# Patient Record
Sex: Female | Born: 1970 | Race: White | Hispanic: No | Marital: Single | Smoking: Current some day smoker
Health system: Southern US, Community
[De-identification: ages and names within clinical notes are randomized; demographics above are authoritative.]

## PROBLEM LIST (undated history)

## (undated) HISTORY — PX: HEMORRHOID SURGERY: SHX153

---

## 2002-01-11 ENCOUNTER — Encounter: Payer: Self-pay | Admitting: Obstetrics

## 2002-01-11 ENCOUNTER — Inpatient Hospital Stay (HOSPITAL_COMMUNITY): Admission: AD | Admit: 2002-01-11 | Discharge: 2002-01-11 | Payer: Self-pay | Admitting: Obstetrics & Gynecology

## 2004-09-10 ENCOUNTER — Inpatient Hospital Stay (HOSPITAL_COMMUNITY): Admission: AD | Admit: 2004-09-10 | Discharge: 2004-09-11 | Payer: Self-pay | Admitting: Obstetrics

## 2004-12-11 ENCOUNTER — Inpatient Hospital Stay (HOSPITAL_COMMUNITY): Admission: AD | Admit: 2004-12-11 | Discharge: 2004-12-11 | Payer: Self-pay | Admitting: Obstetrics

## 2004-12-12 ENCOUNTER — Inpatient Hospital Stay (HOSPITAL_COMMUNITY): Admission: AD | Admit: 2004-12-12 | Discharge: 2004-12-14 | Payer: Self-pay | Admitting: Obstetrics

## 2005-02-14 ENCOUNTER — Encounter (INDEPENDENT_AMBULATORY_CARE_PROVIDER_SITE_OTHER): Payer: Self-pay | Admitting: Specialist

## 2005-02-14 ENCOUNTER — Ambulatory Visit (HOSPITAL_COMMUNITY): Admission: RE | Admit: 2005-02-14 | Discharge: 2005-02-14 | Payer: Self-pay | Admitting: General Surgery

## 2009-03-05 ENCOUNTER — Emergency Department (HOSPITAL_COMMUNITY): Admission: EM | Admit: 2009-03-05 | Discharge: 2009-03-05 | Payer: Self-pay | Admitting: Emergency Medicine

## 2009-10-14 ENCOUNTER — Emergency Department (HOSPITAL_COMMUNITY): Admission: EM | Admit: 2009-10-14 | Discharge: 2009-10-14 | Payer: Self-pay | Admitting: Family Medicine

## 2011-01-12 ENCOUNTER — Encounter: Payer: Self-pay | Admitting: Obstetrics & Gynecology

## 2011-04-04 LAB — RAPID STREP SCREEN (MED CTR MEBANE ONLY): Streptococcus, Group A Screen (Direct): NEGATIVE

## 2011-05-10 NOTE — Op Note (Signed)
Katherine Chan, Katherine Chan           ACCOUNT NO.:  [id number]   MEDICAL RECORD NO.:  [id number]          PATIENT TYPE:  AMB   LOCATION:  DAY                          FACILITY:  [facility]   PHYSICIAN:  Leonie Man, M.D.   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   PROCEDURE:  Excision of anal polyp and PPH.   SURGEON:  Dr. Lurene Shadow.   ASSISTANT:  Nurse.   ANESTHESIA:  General.   The patient is a 40 year old woman with prolapsing hemorrhoid disease who on  examination is noted to also have an anal polyp. She wishes to have her  hemorrhoids removed as well as the polyps, and she comes to the operating  room for same.   PROCEDURE:  The procedure details are discussed with respect to the risks  and benefits with the patient. She understands these risks and gives  consent. Following induction of satisfactory anesthesia, the patient is  positioned prone position and then with the table adjusted into the  jackknife position. Buttock cheeks are spread apart to expose the anus. The  perineal area and vagina prepped and draped to be included in a sterile  operative field. I infiltrated the perianal region with 1% lidocaine with  epinephrine and W[place]. The anal verge then dilated up to three  fingerbreadths and an operating anoscope was placed within the anus. A purse-  string suture is made approximately 4.5 cm above the dentate line to  completely encircle the anal canal. The purse-string suture is tested and  drawn taut. There no sutures tenting of the area of the vaginal wall. The  PPH device was then placed with the anvil above the suture line, and this is  tightened down to the full 4-cm length while pulling on the purse-string  sutures so as to pull  the hemorrhoidal tissues into the PPH stapling device.  Pressure is held in this position for a total of 60 seconds, and then the  [facility] device is fired. Additional pressure is held for an additional 30  seconds and the PPH device then is removed. The specimen which measured  approximately 2 cm in width in a complete circle was noted and that was  forwarded for pathologic evaluation. There were no muscle fibers within the  specimen. The suture line was then checked for hemostasis. Additional  bleeding points treated with electrocautery. A Gelfoam pack was then placed  at the suture line for additional hemostasis. Sponge and instrument counts  were verified, and the patient removed from the operating room to the  recovery room in stable condition. She tolerated the procedure well.    PB/MEDQ  D:  02/14/2005  T:  02/14/2005  Job:  [id number]

## 2012-03-31 ENCOUNTER — Emergency Department (INDEPENDENT_AMBULATORY_CARE_PROVIDER_SITE_OTHER)
Admission: EM | Admit: 2012-03-31 | Discharge: 2012-03-31 | Disposition: A | Discharge status: Home / Self Care | Payer: Self-pay | Source: Home / Self Care | Attending: Emergency Medicine | Admitting: Emergency Medicine

## 2012-03-31 ENCOUNTER — Encounter (HOSPITAL_COMMUNITY): Payer: Self-pay

## 2012-03-31 DIAGNOSIS — J020 Streptococcal pharyngitis: Secondary | ICD-10-CM

## 2012-03-31 MED ORDER — PENICILLIN V POTASSIUM 500 MG PO TABS: 500.0000 mg | ORAL_TABLET | Freq: Three times a day (TID) | ORAL | Status: AC

## 2012-03-31 MED ORDER — GUAIFENESIN-CODEINE 100-10 MG/5ML PO SYRP: 5.0000 mL | ORAL_SOLUTION | Freq: Three times a day (TID) | ORAL | Status: AC | PRN

## 2012-03-31 NOTE — ED Provider Notes (Signed)
History     CSN: [id number]  Arrival date & time 03/31/12  4098   First MD Initiated Contact with Patient 03/31/12 [phone]      Chief Complaint  Patient presents with  . Sore Throat  . Cough    (Consider location/radiation/quality/duration/timing/severity/associated sxs/prior treatment) HPI Comments: Patient presents urgent care complaining of a moderate sore throat ear pressure discomfort with predominantly right ear pressure congestion. With a greenish type of mucousy discharge when she blows her nose. Did experience a tactile fever at home Saturday. No shortness of breath but within the last 2 days throat discomfort has increased  Patient is a 41 y.o. female presenting with pharyngitis and cough. The history is provided by the patient.  Sore Throat This is a new problem. The current episode started more than 2 days ago. The problem occurs constantly. The problem has not changed since onset.Associated symptoms include headaches. Pertinent negatives include no chest pain and no shortness of breath. She has tried acetaminophen for the symptoms. The treatment provided no relief.  Cough Associated symptoms include chills, headaches and rhinorrhea. Pertinent negatives include no chest pain, no ear pain and no shortness of breath.    History reviewed. No pertinent past medical history.  Past Surgical History  Procedure Date  . Hemorrhoid surgery     No family history on file.  History  Substance Use Topics  . Smoking status: Never Smoker   . Smokeless tobacco: Not on file  . Alcohol Use: Yes    OB History    Grav Para Term Preterm Abortions TAB SAB Ect Mult Living                  Review of Systems  Constitutional: Positive for fever, chills, appetite change and fatigue.  HENT: Positive for congestion and rhinorrhea. Negative for ear pain, neck pain and neck stiffness.   Eyes: Negative for photophobia and pain.  Respiratory: Positive for cough. Negative for chest tightness  and shortness of breath.   Cardiovascular: Negative for chest pain.  Genitourinary: Negative for dysuria.  Neurological: Positive for headaches.    Allergies  Review of patient's allergies indicates no known allergies.  Home Medications  No current outpatient prescriptions on file.  BP 128/78  Pulse 74  Temp(Src) 98.2 F (36.8 C) (Oral)  Resp 14  SpO2 100%  LMP 03/23/2012  Physical Exam  Nursing note and vitals reviewed. Constitutional: She appears well-developed and well-nourished.  Non-toxic appearance. She does not have a sickly appearance. She does not appear ill. No distress.  HENT:  Head: Normocephalic.  Right Ear: Tympanic membrane normal.  Left Ear: Tympanic membrane normal.  Nose: Nose normal.  Mouth/Throat: Uvula is midline and mucous membranes are normal. Posterior oropharyngeal edema and posterior oropharyngeal erythema present. No tonsillar abscesses.  Eyes: Conjunctivae are normal. No scleral icterus.  Neck: Neck supple. No JVD present.  Pulmonary/Chest: Effort normal. No respiratory distress. She has no decreased breath sounds. She has no wheezes. She has no rhonchi. She has no rales.  Lymphadenopathy:    She has cervical adenopathy.  Skin: No rash noted. No erythema.    ED Course  Procedures (including critical care time)  Labs Reviewed  POCT RAPID STREP A ([facility] ONLY) - Abnormal; Notable for the following:    Streptococcus, Group A Screen (Direct) POSITIVE (*)    All other components within normal limits   No results found.   No diagnosis found.    MDM  Marked pharyngitis with  reactive lymphadenopathies.        Jimmie Molly, MD 03/31/12 1011

## 2012-03-31 NOTE — ED Notes (Signed)
States she flew on airplane Friday.  On Saturday started with sore throat, rt ear congestion and congestion between rt ear and nose, cough.  States she has had some green nasal secretions.

## 2012-03-31 NOTE — Discharge Instructions (Signed)

## 2014-06-21 ENCOUNTER — Encounter (HOSPITAL_COMMUNITY): Payer: Self-pay | Admitting: Emergency Medicine

## 2014-06-21 ENCOUNTER — Emergency Department (HOSPITAL_COMMUNITY)
Admission: EM | Admit: 2014-06-21 | Discharge: 2014-06-21 | Disposition: A | Discharge status: Home / Self Care | Payer: No Typology Code available for payment source | Attending: Emergency Medicine | Admitting: Emergency Medicine

## 2014-06-21 ENCOUNTER — Emergency Department (HOSPITAL_COMMUNITY): Payer: No Typology Code available for payment source

## 2014-06-21 DIAGNOSIS — F17200 Nicotine dependence, unspecified, uncomplicated: Secondary | ICD-10-CM | POA: Insufficient documentation

## 2014-06-21 DIAGNOSIS — Z7982 Long term (current) use of aspirin: Secondary | ICD-10-CM | POA: Insufficient documentation

## 2014-06-21 DIAGNOSIS — Z79899 Other long term (current) drug therapy: Secondary | ICD-10-CM | POA: Insufficient documentation

## 2014-06-21 DIAGNOSIS — R0602 Shortness of breath: Secondary | ICD-10-CM | POA: Insufficient documentation

## 2014-06-21 DIAGNOSIS — R079 Chest pain, unspecified: Secondary | ICD-10-CM | POA: Insufficient documentation

## 2014-06-21 LAB — CBC WITH DIFFERENTIAL/PLATELET
BASOS ABS: 0 10*3/uL (ref 0.0–0.1)
BASOS PCT: 0 % (ref 0–1)
EOS ABS: 0.5 10*3/uL (ref 0.0–0.7)
EOS PCT: 4 % (ref 0–5)
HCT: 41.6 % (ref 36.0–46.0)
Hemoglobin: 13.7 g/dL (ref 12.0–15.0)
Lymphocytes Relative: 14 % (ref 12–46)
Lymphs Abs: 1.6 10*3/uL (ref 0.7–4.0)
MCH: 31.5 pg (ref 26.0–34.0)
MCHC: 32.9 g/dL (ref 30.0–36.0)
MCV: 95.6 fL (ref 78.0–100.0)
MONO ABS: 1.1 10*3/uL — AB (ref 0.1–1.0)
Monocytes Relative: 10 % (ref 3–12)
Neutro Abs: 8.4 10*3/uL — ABNORMAL HIGH (ref 1.7–7.7)
Neutrophils Relative %: 72 % (ref 43–77)
PLATELETS: 205 10*3/uL (ref 150–400)
RBC: 4.35 MIL/uL (ref 3.87–5.11)
RDW: 13.6 % (ref 11.5–15.5)
WBC: 11.6 10*3/uL — AB (ref 4.0–10.5)

## 2014-06-21 LAB — I-STAT TROPONIN, ED
TROPONIN I, POC: 0 ng/mL (ref 0.00–0.08)
Troponin i, poc: 0 ng/mL (ref 0.00–0.08)

## 2014-06-21 LAB — BASIC METABOLIC PANEL
BUN: 10 mg/dL (ref 6–23)
CALCIUM: 8.6 mg/dL (ref 8.4–10.5)
CO2: 25 mEq/L (ref 19–32)
CREATININE: 0.75 mg/dL (ref 0.50–1.10)
Chloride: 105 mEq/L (ref 96–112)
GFR calc Af Amer: >90 mL/min (ref >90)
Glucose, Bld: 90 mg/dL (ref 70–99)
Potassium: 3.8 mEq/L (ref 3.7–5.3)
SODIUM: 142 meq/L (ref 137–147)

## 2014-06-21 NOTE — ED Notes (Signed)
Pt presents to department for evaluation of midsternal chest pain and SOB. Onset this morning. 3/10 pain upon arrival, becomes worse with laying flat. Respirations unlabored. Pt is alert and oriented x4. NAD.

## 2014-06-21 NOTE — Discharge Instructions (Signed)
Return to the ED with any concerns including increased pain, difficulty breathing, leg swelling, fainting, decreased level of alertness/lethargy, or any other alarming symptoms

## 2014-06-21 NOTE — ED Provider Notes (Signed)
CSN: [id number]     Arrival date & time 06/21/14  0901 History   First MD Initiated Contact with Patient 06/21/14 [phone]     Chief Complaint  Patient presents with  . Chest Pain     (Consider location/radiation/quality/duration/timing/severity/associated sxs/prior Treatment) HPI Pt presents with c/o midsternal chest pain.  She states the pain is worse with lying and flat and worse with movement of her shoulders.  No fever/chills.  No cough.  No abodminal discomfort or nausea.  Pt states pain feels like a soreness.  No sob, no nausea, no diaphoresis.  No radiation of the pain.  No hx of DVT/PE, no hx of travel trauma/surgery.  There are no other associated systemic symptoms, there are no other alleviating or modifying factors.   History reviewed. No pertinent past medical history. Past Surgical History  Procedure Laterality Date  . Hemorrhoid surgery     No family history on file. History  Substance Use Topics  . Smoking status: Current Some Day Smoker    Types: Cigarettes  . Smokeless tobacco: Not on file  . Alcohol Use: Yes   OB History   Grav Para Term Preterm Abortions TAB SAB Ect Mult Living                 Review of Systems ROS reviewed and all otherwise negative except for mentioned in HPI    Allergies  Review of patient's allergies indicates no known allergies.  Home Medications   Prior to Admission medications   Medication Sig Start Date End Date Taking? Authorizing Provider  Aspirin-Acetaminophen-Caffeine (GOODY HEADACHE PO) Take 1 packet by mouth daily as needed (for pain).   Yes Historical Provider, MD  Multiple Vitamin (MULTIVITAMIN WITH MINERALS) TABS tablet Take 1 tablet by mouth daily as needed (for vitamin).   Yes Historical Provider, MD  Nutritional Supp - Diet Aids (ULTRA SLIM QUICK PO) Take 3 capsules by mouth 2 (two) times daily.   Yes Historical Provider, MD   BP 101/64  Pulse 57  Temp(Src) 98 F (36.7 C) (Oral)  Resp 16  SpO2 99% Vitals  reviewed Physical Exam Physical Examination: General appearance - alert, well appearing, and in no distress Mental status - alert, oriented to person, place, and time Eyes - no conjunctival injection, no scleral icterus Mouth - mucous membranes moist, pharynx normal without lesions Chest - clear to auscultation, no wheezes, rales or rhonchi, symmetric air entry, nontender to palpation Heart - normal rate, regular rhythm, normal S1, S2, no murmurs, rubs, clicks or gallops Abdomen - soft, nontender, nondistended, no masses or organomegaly Extremities - peripheral pulses normal, no pedal edema, no clubbing or cyanosis Skin - normal coloration and turgor, no rashes  ED Course  Procedures (including critical care time) Labs Review Labs Reviewed  CBC WITH DIFFERENTIAL - Abnormal; Notable for the following:    WBC 11.6 (*)    Neutro Abs 8.4 (*)    Monocytes Absolute 1.1 (*)    All other components within normal limits  BASIC METABOLIC PANEL  I-STAT TROPOININ, ED  I-STAT TROPOININ, ED    Imaging Review No results found.   EKG Interpretation   Date/Time:  Tuesday June 21 2014 09:08:06 EDT Ventricular Rate:  65 PR Interval:  146 QRS Duration: 86 QT Interval:  392 QTC Calculation: 407 R Axis:   92 Text Interpretation:  Normal sinus rhythm with sinus arrhythmia Rightward  axis Borderline ECG ED PHYSICIAN INTERPRETATION AVAILABLE IN [facility] Confirmed by TEST, Record ([id number]) on  06/23/2014 7:03:22 AM      MDM   Final diagnoses:  Chest pain, unspecified chest pain type    Pt presenting with c/o chest discomfort since this morning.  CXR and labs were reassuring including 2 sets of tropnonins.  Doubt ACS, PERC score is 0, doubt other acute emergent procedure at this time.  Discharged with strict return precautions.  Pt agreeable with plan.    Ethelda ChickMartha K Linker, MD 06/24/14 [phone]

## 2018-11-25 NOTE — Progress Notes (Signed)
Ms. Katherine Chan received her flu shot to [facility] by the undersigned. Lot#[id number] NDC: [id number] Mfg: [employer] Exp. 06/22/19

## 2019-06-18 ENCOUNTER — Other Ambulatory Visit (HOSPITAL_COMMUNITY): Payer: Self-pay

## 2019-06-18 DIAGNOSIS — F1120 Opioid dependence, uncomplicated: Secondary | ICD-10-CM

## 2019-06-22 ENCOUNTER — Other Ambulatory Visit (HOSPITAL_COMMUNITY): Payer: No Typology Code available for payment source

## 2020-02-01 ENCOUNTER — Other Ambulatory Visit (HOSPITAL_COMMUNITY)
Admission: RE | Admit: 2020-02-01 | Discharge: 2020-02-01 | Disposition: A | Discharge status: Home / Self Care | Payer: BC Managed Care – PPO | Source: Ambulatory Visit | Attending: Family Medicine | Admitting: Family Medicine

## 2020-02-01 ENCOUNTER — Other Ambulatory Visit: Payer: Self-pay | Admitting: Family Medicine

## 2020-02-01 DIAGNOSIS — Z124 Encounter for screening for malignant neoplasm of cervix: Secondary | ICD-10-CM | POA: Diagnosis present

## 2020-02-02 ENCOUNTER — Other Ambulatory Visit: Payer: Self-pay | Admitting: Family Medicine

## 2020-02-02 DIAGNOSIS — Z1231 Encounter for screening mammogram for malignant neoplasm of breast: Secondary | ICD-10-CM

## 2020-02-03 LAB — CYTOLOGY - PAP
Comment: NEGATIVE
Diagnosis: UNDETERMINED — AB
High risk HPV: NEGATIVE

## 2020-03-08 ENCOUNTER — Ambulatory Visit
Admission: RE | Admit: 2020-03-08 | Discharge: 2020-03-08 | Disposition: A | Discharge status: Home / Self Care | Payer: BC Managed Care – PPO | Source: Ambulatory Visit | Attending: Family Medicine | Admitting: Family Medicine

## 2020-03-08 ENCOUNTER — Other Ambulatory Visit: Payer: Self-pay

## 2020-03-08 DIAGNOSIS — Z1231 Encounter for screening mammogram for malignant neoplasm of breast: Secondary | ICD-10-CM

## 2020-03-10 ENCOUNTER — Other Ambulatory Visit: Payer: Self-pay | Admitting: Family Medicine

## 2020-03-10 DIAGNOSIS — R928 Other abnormal and inconclusive findings on diagnostic imaging of breast: Secondary | ICD-10-CM

## 2020-03-28 ENCOUNTER — Ambulatory Visit
Admission: RE | Admit: 2020-03-28 | Discharge: 2020-03-28 | Disposition: A | Discharge status: Home / Self Care | Payer: BC Managed Care – PPO | Source: Ambulatory Visit | Attending: Family Medicine | Admitting: Family Medicine

## 2020-03-28 ENCOUNTER — Other Ambulatory Visit: Payer: Self-pay

## 2020-03-28 DIAGNOSIS — R928 Other abnormal and inconclusive findings on diagnostic imaging of breast: Secondary | ICD-10-CM

## 2020-07-16 IMAGING — MG DIGITAL SCREENING BILAT W/ TOMO W/ CAD
8 series · 9 of 24 positions shown · non-contrast
Comparison: None

CLINICAL DATA: Screening. Baseline examination.

EXAM:
DIGITAL SCREENING BILATERAL MAMMOGRAM WITH TOMO AND CAD

[L CC synth-2D]
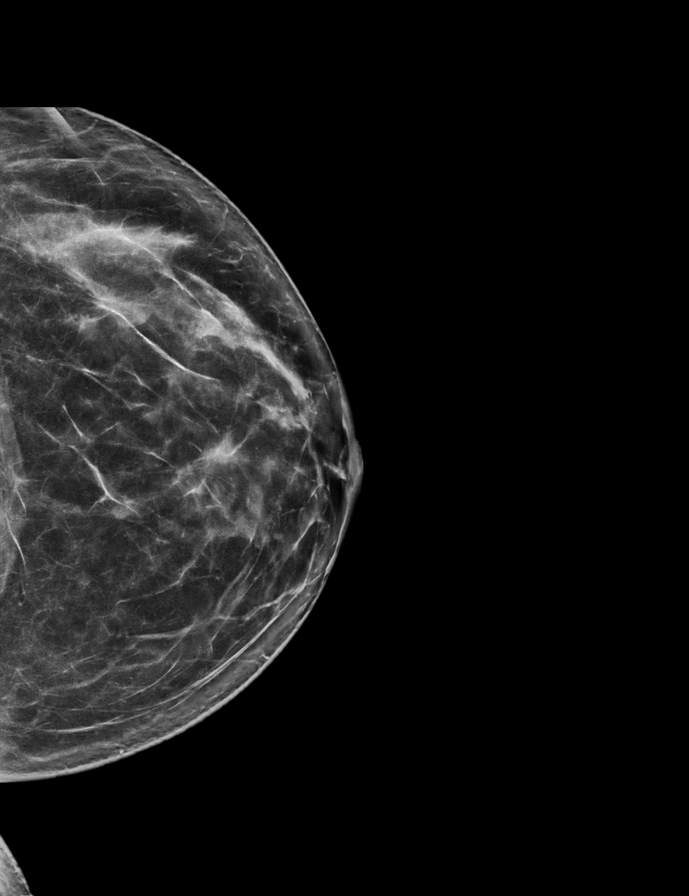

[R CC synth-2D]
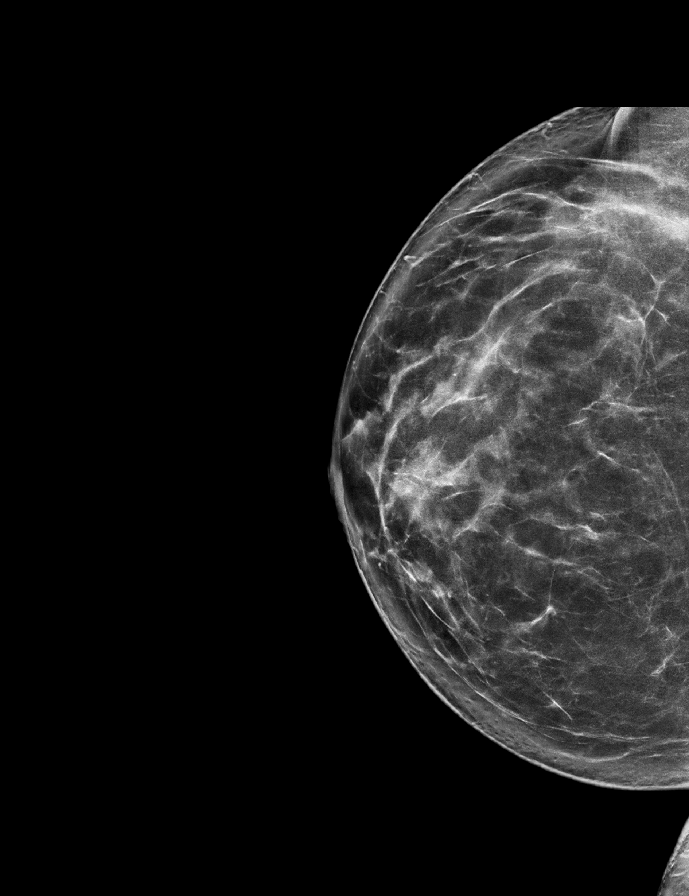

[R MLO synth-2D]
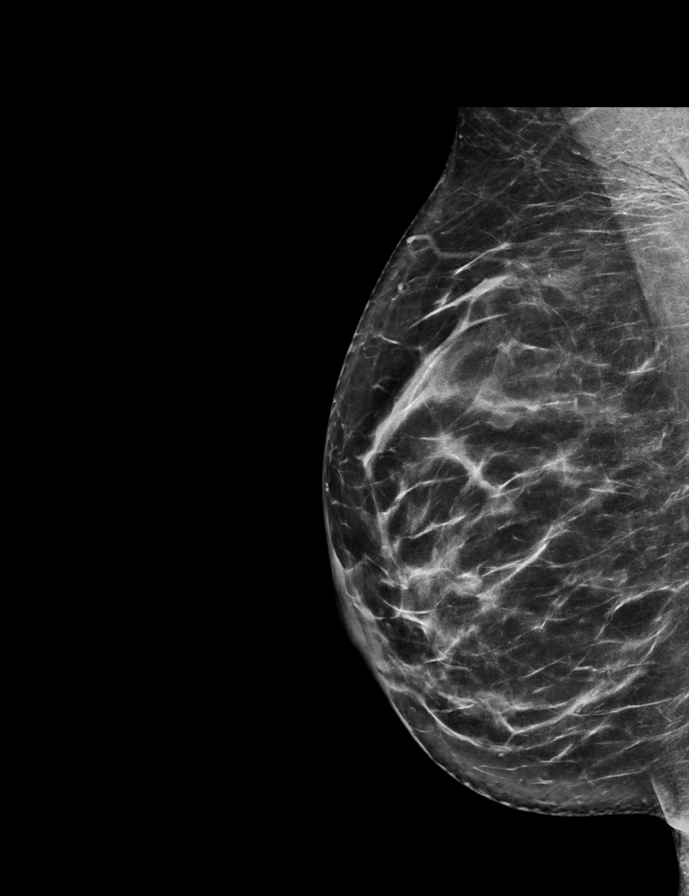

[L MLO synth-2D]
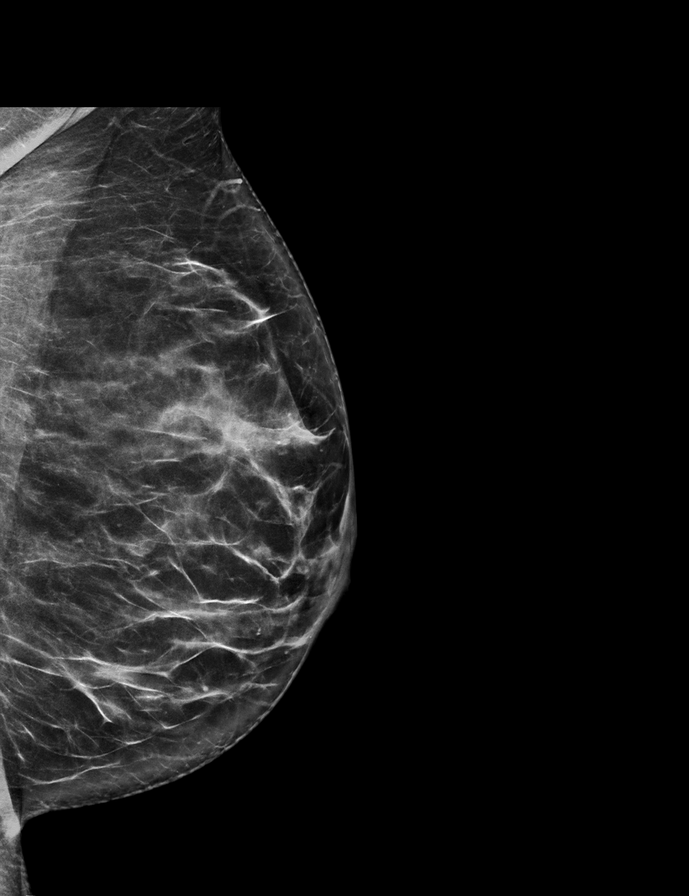

[L MLO tomo · 2 of 71 frames shown]
[frame 23/71]
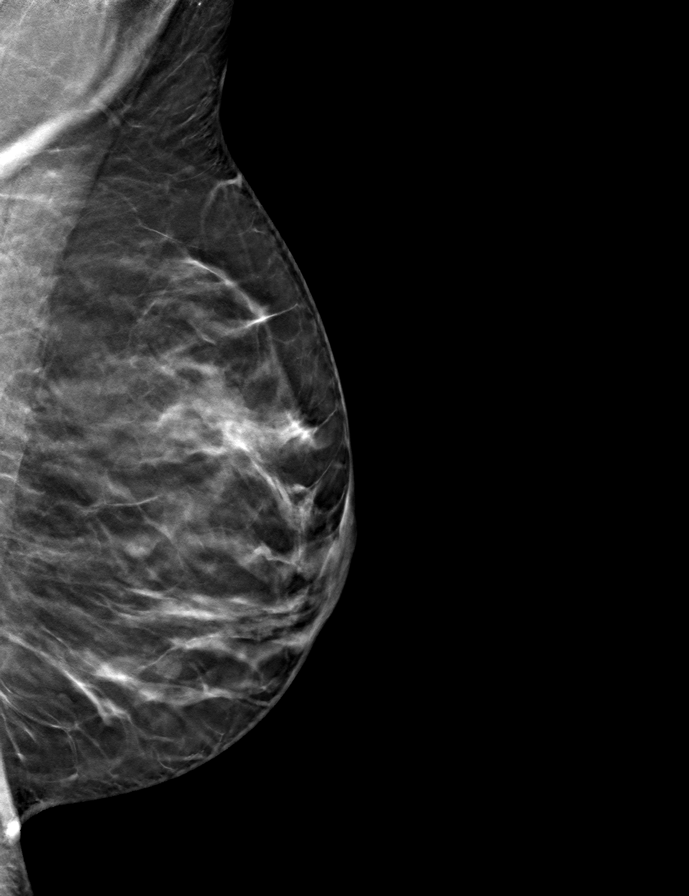
[frame 36/71]
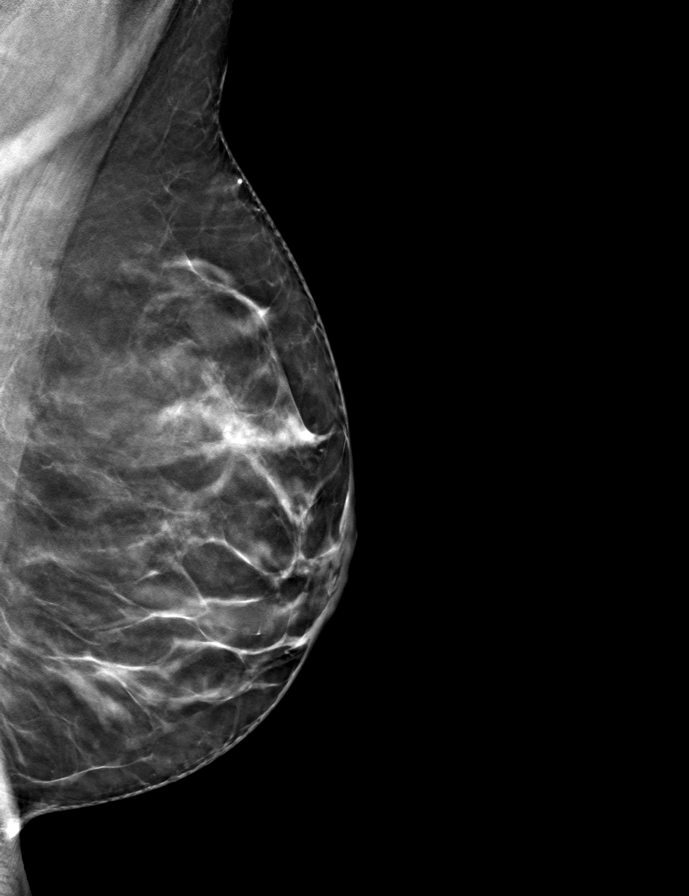

[L CC tomo · tomo slice 37/72.0]
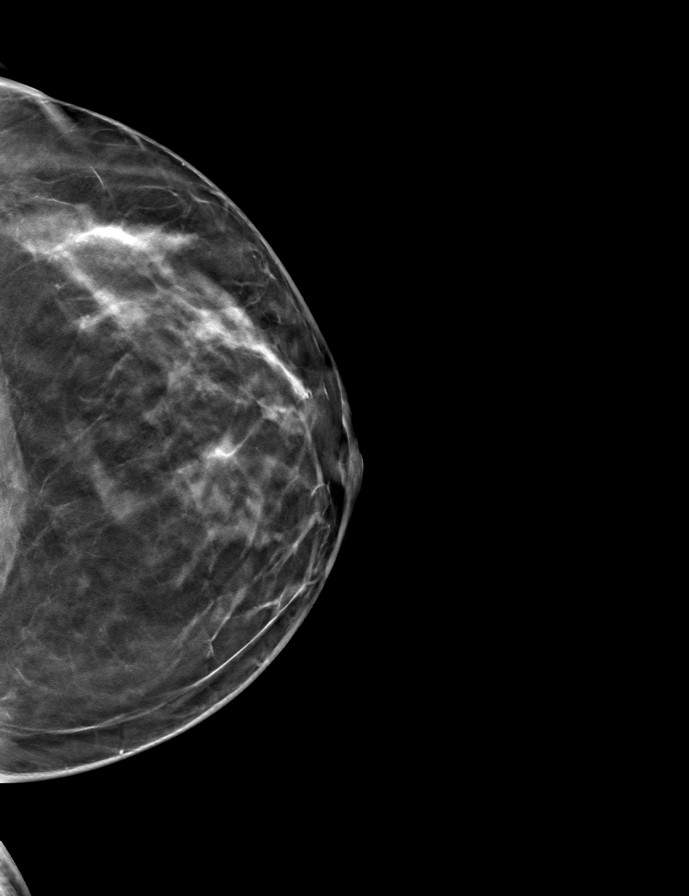

[R CC tomo · tomo slice 41/80.0]
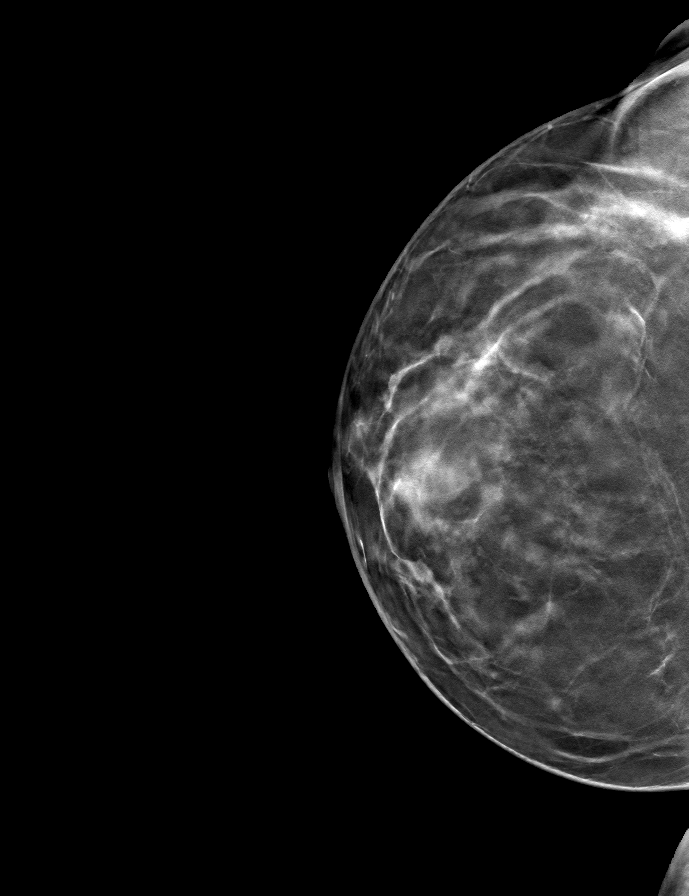

[R MLO tomo · tomo slice 38/75.0]
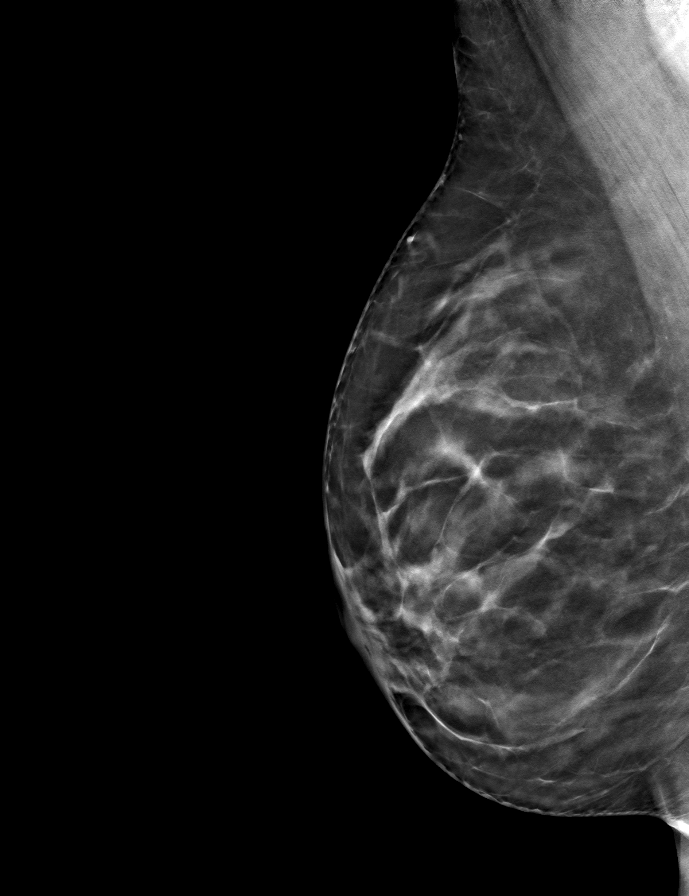

[9 of 24 positions shown; findings below may reference images not displayed]

ACR Breast Density Category b: There are scattered areas of
fibroglandular density.
FINDINGS: In the left breast, a possible asymmetry warrants further
evaluation. In the right breast, no findings suspicious for
malignancy. Images were processed with CAD.
IMPRESSION: Further evaluation is suggested for possible asymmetry in the left
breast.

RECOMMENDATION:
Diagnostic mammogram and possibly ultrasound of the left breast.
(Code:O7-L-337)

The patient will be contacted regarding the findings, and additional
imaging will be scheduled.

BI-RADS CATEGORY  0: Incomplete. Need additional imaging evaluation
and/or prior mammograms for comparison.

## 2020-08-05 IMAGING — US US BREAST*L* LIMITED INC AXILLA
1 series · 14 of 14 positions shown · non-contrast
Comparison: Previous exam(s).

CLINICAL DATA: The patient was called back from screening
mammography due to 2 left breast asymmetries.

EXAM:
DIGITAL DIAGNOSTIC LEFT MAMMOGRAM WITH TOMO
ULTRASOUND LEFT BREAST

[Series 1: us breast*left* limited inc axilla · 0.06mm/px · 14 of 14 slices shown]
[im 1/14]
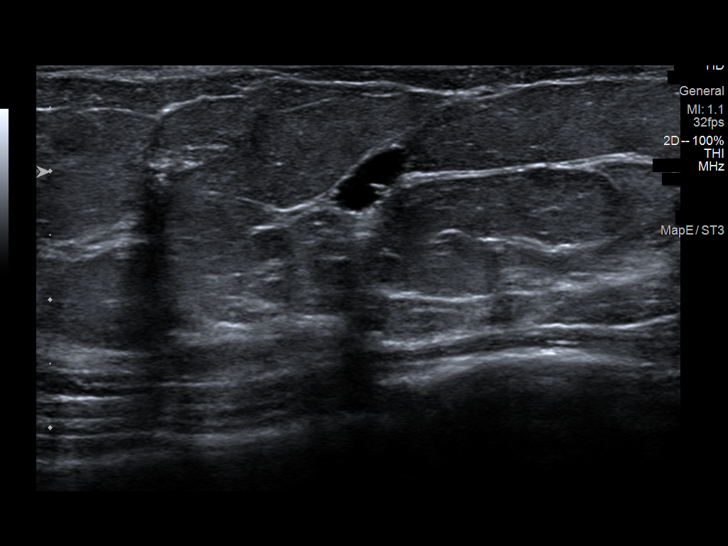
[im 2/14]
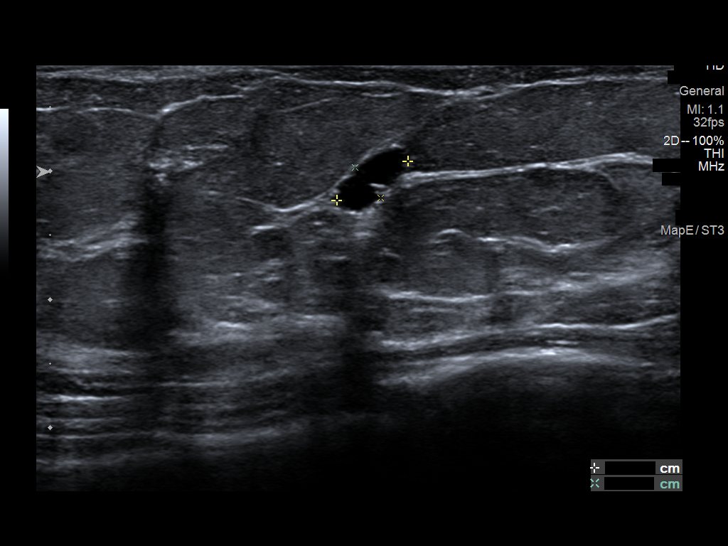
[im 3/14]
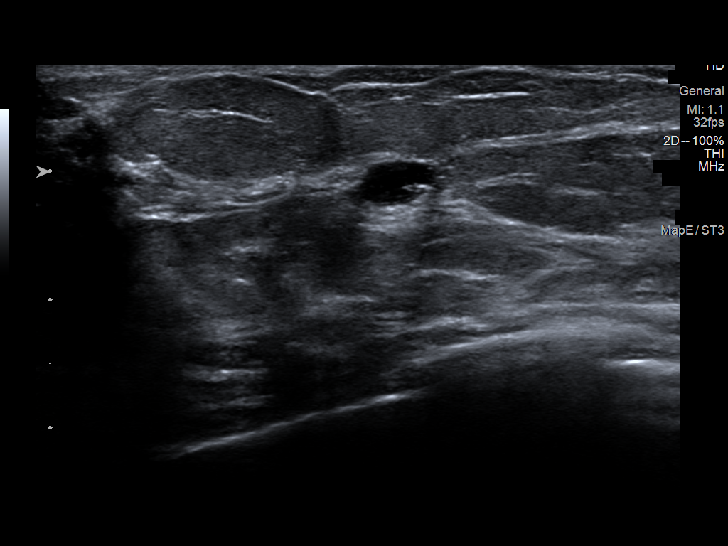
[im 4/14]
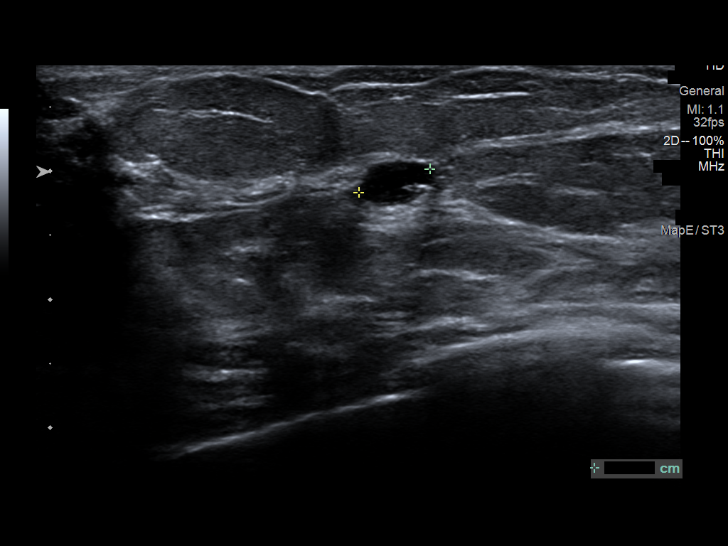
[im 5/14]
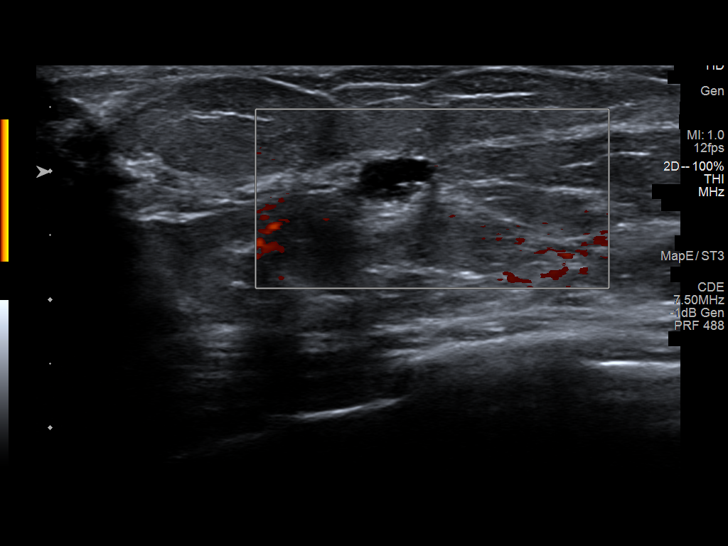
[im 6/14]
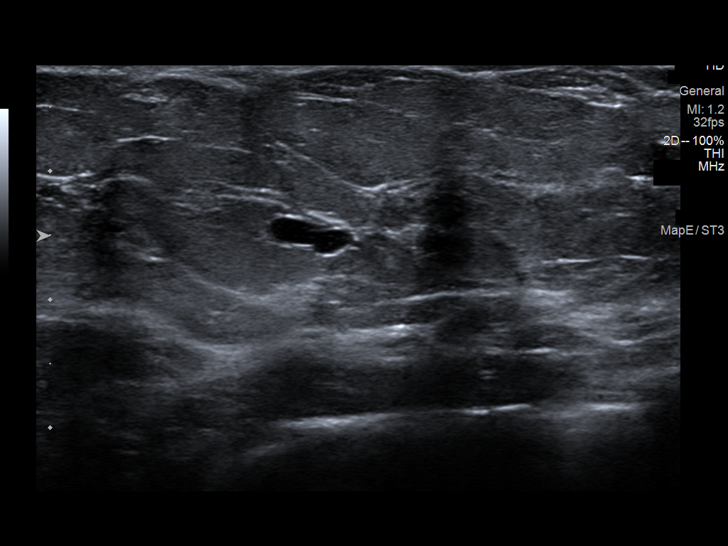
[im 7/14]
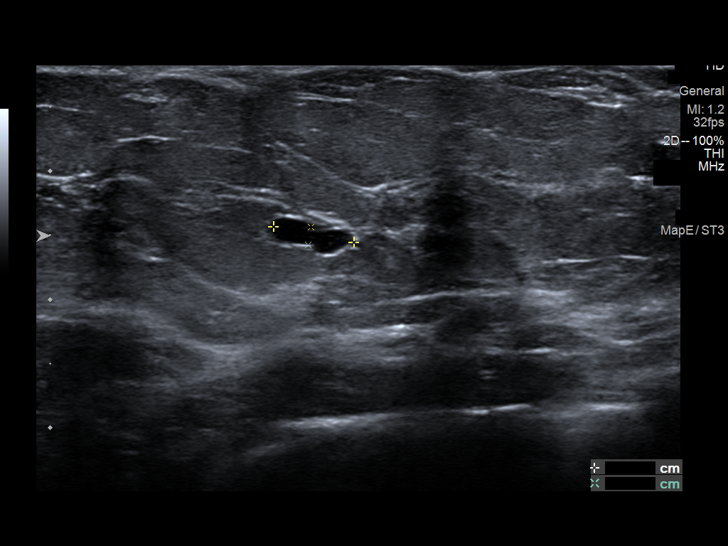
[im 8/14]
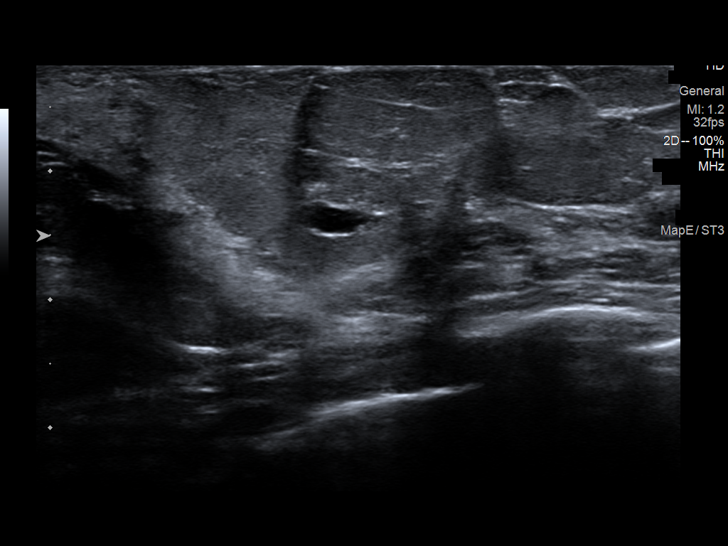
[im 9/14]
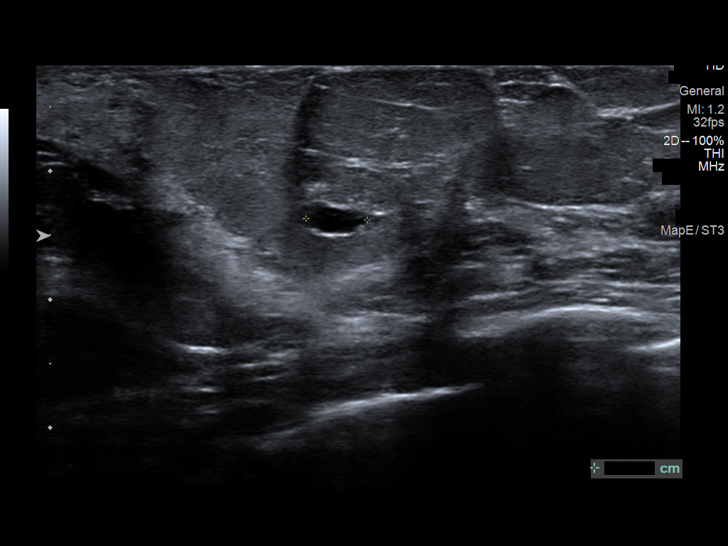
[im 10/14]
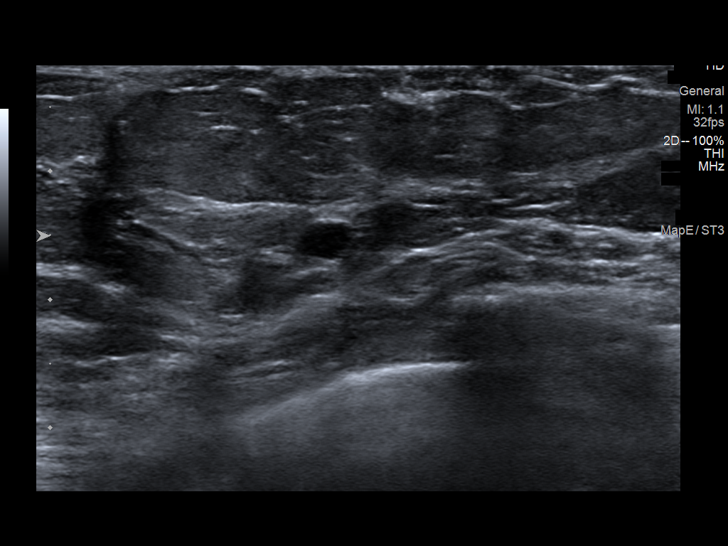
[im 11/14]
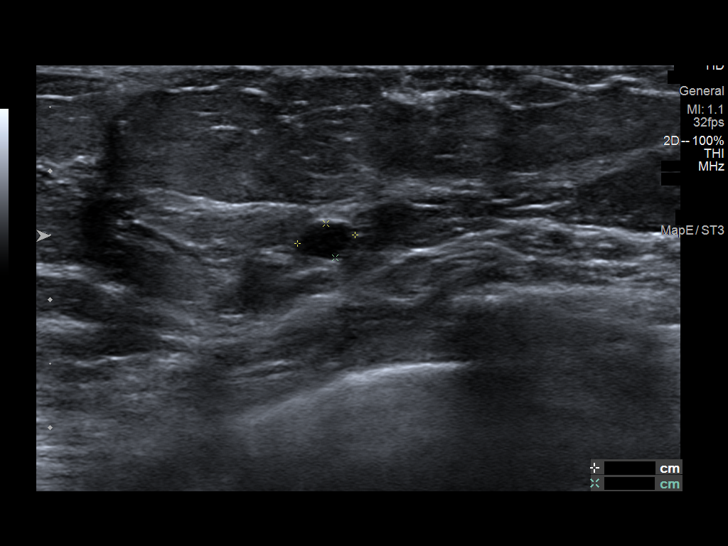
[im 12/14]
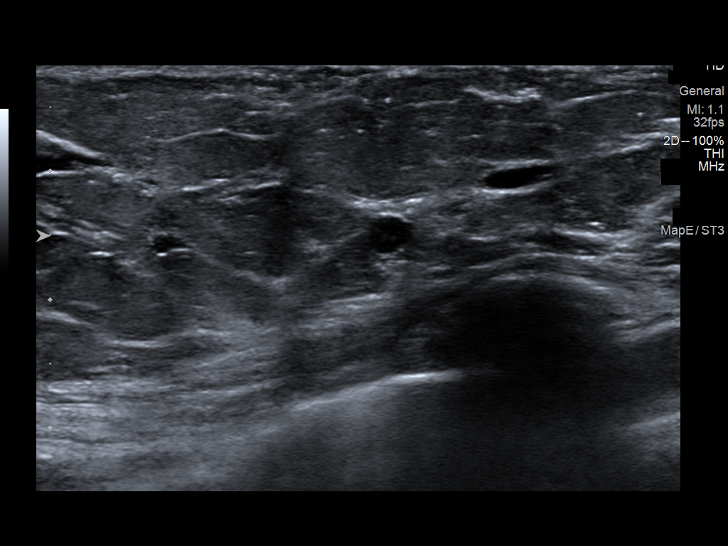
[im 13/14]
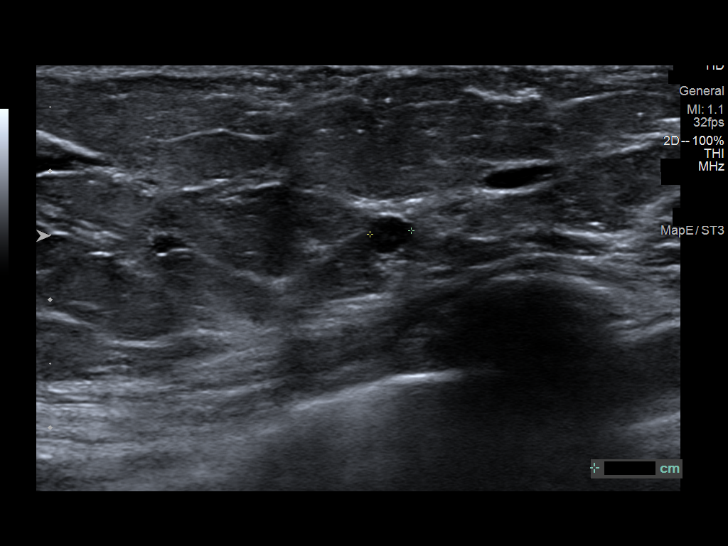
[im 14/14]
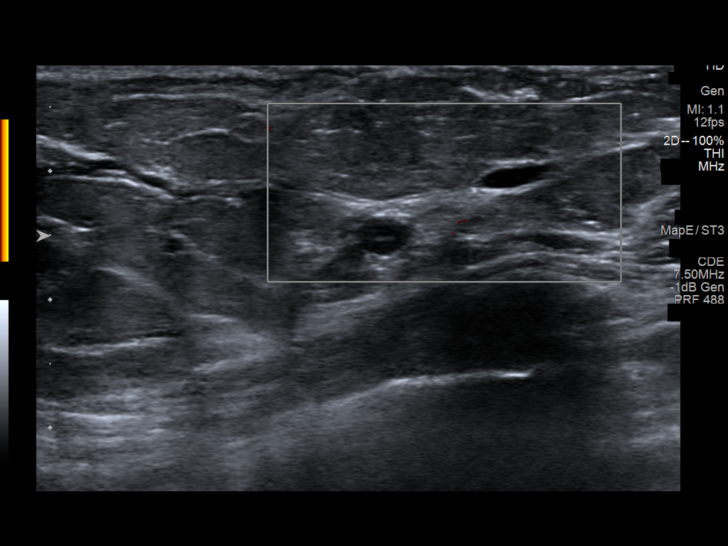

[14 of 14 positions shown; findings below may reference images not displayed]

ACR Breast Density Category b: There are scattered areas of
fibroglandular density.
FINDINGS: The asymmetries resolve on additional imaging. However, 1 or 2
masses are seen in the lower outer left breast.

On physical exam, no suspicious lumps are identified.

Targeted ultrasound is performed, showing fibrocystic changes in the
left breast accounting for the mammographically identified masses.
IMPRESSION: Fibrocystic changes.  No evidence of malignancy in the left breast.

RECOMMENDATION:
Annual screening mammography.

I have discussed the findings and recommendations with the patient.
If applicable, a reminder letter will be sent to the patient
regarding the next appointment.

BI-RADS CATEGORY  2: Benign.

## 2021-02-01 ENCOUNTER — Other Ambulatory Visit (HOSPITAL_COMMUNITY)
Admission: RE | Admit: 2021-02-01 | Discharge: 2021-02-01 | Disposition: A | Discharge status: Home / Self Care | Payer: BC Managed Care – PPO | Source: Ambulatory Visit | Attending: Family Medicine | Admitting: Family Medicine

## 2021-02-01 ENCOUNTER — Other Ambulatory Visit: Payer: Self-pay | Admitting: Family Medicine

## 2021-02-01 DIAGNOSIS — Z1211 Encounter for screening for malignant neoplasm of colon: Secondary | ICD-10-CM | POA: Insufficient documentation

## 2021-02-01 DIAGNOSIS — Z1151 Encounter for screening for human papillomavirus (HPV): Secondary | ICD-10-CM | POA: Insufficient documentation

## 2021-02-01 DIAGNOSIS — Z124 Encounter for screening for malignant neoplasm of cervix: Secondary | ICD-10-CM | POA: Diagnosis present

## 2021-02-05 LAB — CYTOLOGY - PAP
Comment: NEGATIVE
Diagnosis: NEGATIVE
High risk HPV: NEGATIVE
